# Patient Record
Sex: Female | Born: 2000 | Race: White | Hispanic: Yes | Marital: Single | Smoking: Never smoker
Health system: Southern US, Community
[De-identification: ages and names within clinical notes are randomized; demographics above are authoritative.]

## PROBLEM LIST (undated history)

## (undated) DIAGNOSIS — Z6221 Child in welfare custody: Secondary | ICD-10-CM

## (undated) DIAGNOSIS — IMO0002 Reserved for concepts with insufficient information to code with codable children: Secondary | ICD-10-CM

## (undated) HISTORY — DX: Reserved for concepts with insufficient information to code with codable children: IMO0002

## (undated) HISTORY — DX: Child in welfare custody: Z62.21

---
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
## 2019-04-07 IMAGING — MR MR KNEE*L* W/O CM
4 of 5 series · 20 of 40 positions shown · non-contrast
Comparison: None.

CLINICAL DATA: Left lateral knee pain for 7 years

EXAM:
MRI OF THE LEFT KNEE WITHOUT CONTRAST
TECHNIQUE: Multiplanar, multisequence MR imaging of the knee was performed. No
intravenous contrast was administered.

[Series 3: pd_tse_fs_tra · axial · 4.0mm · 0.42mm/px · z∈[-60,+10]mm · 3 of 24 slices shown]
[im 4/24]
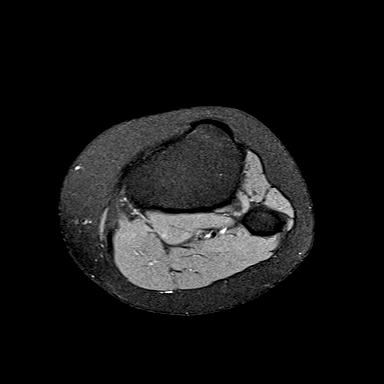
[im 14/24]
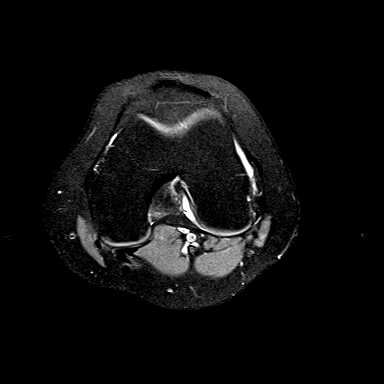
[im 20/24]
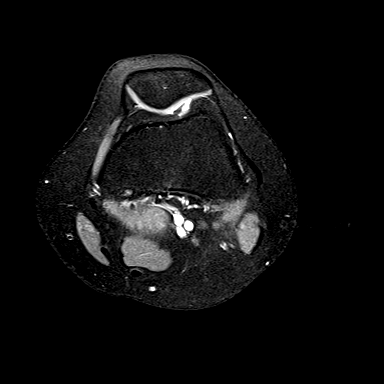

[Series 5: T2 fat-sat · coronal · 3.2mm · 0.62mm/px · 6 of 26 slices shown]
[im 1/26]
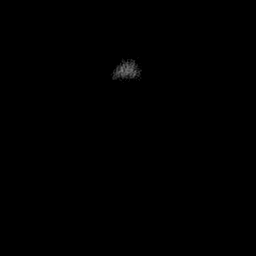
[im 4/26]
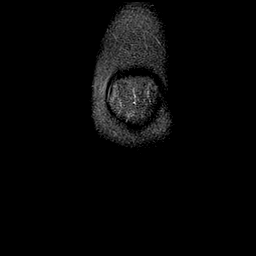
[im 8/26]
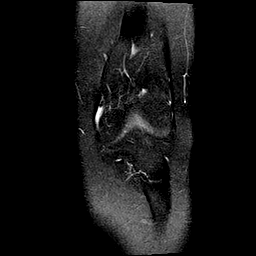
[im 11/26]
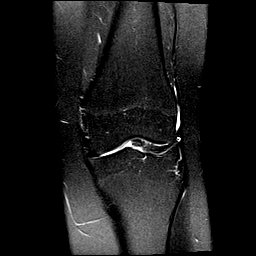
[im 15/26]
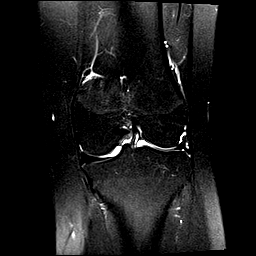
[im 22/26]
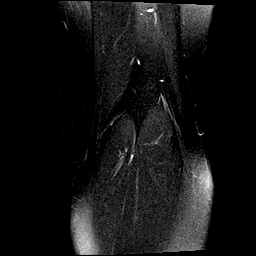

[Series 6: T1 · coronal · 3.2mm · 0.25mm/px · 3 of 26 slices shown]
[im 4/26]
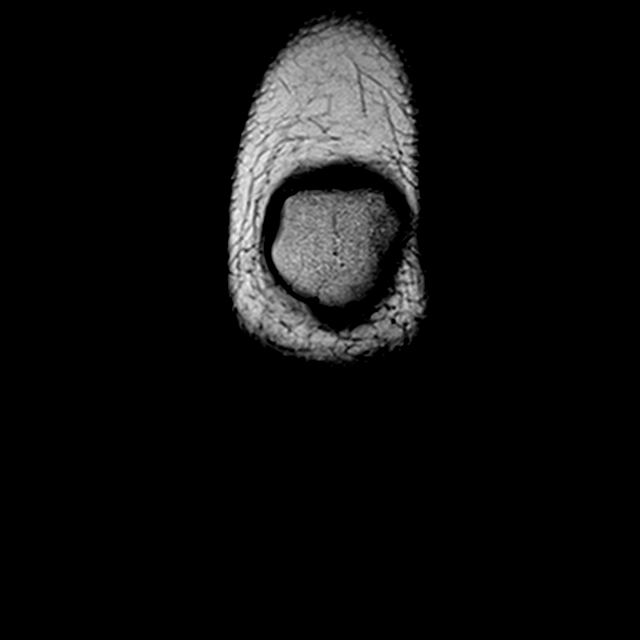
[im 15/26]
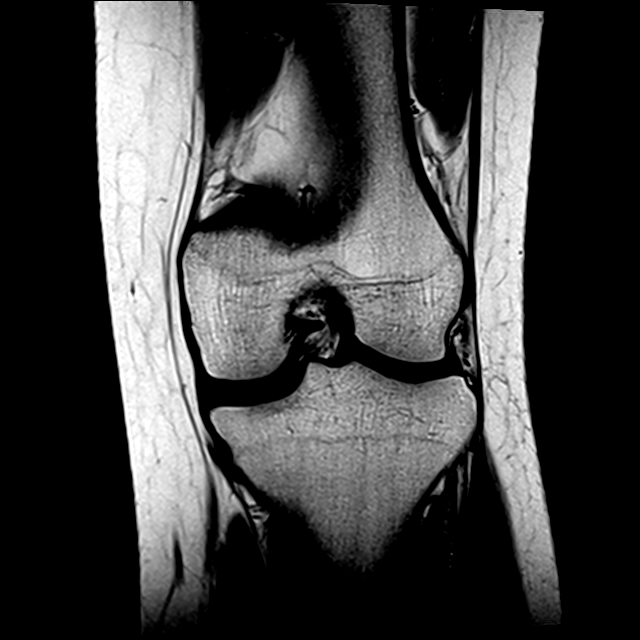
[im 22/26]
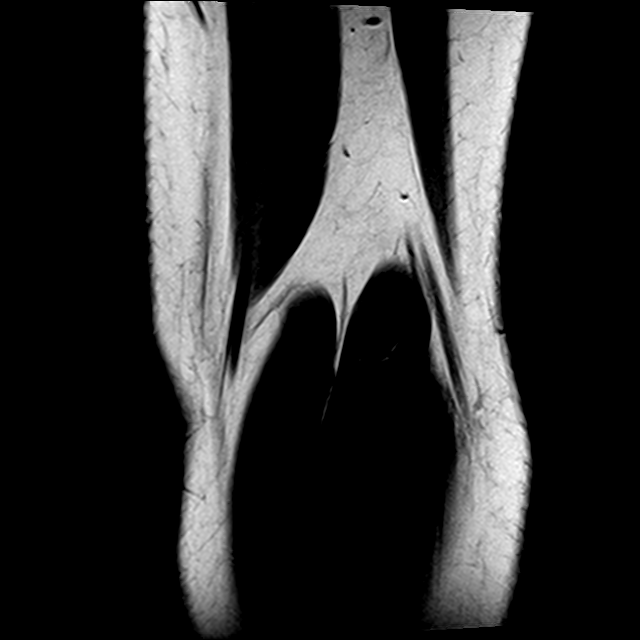

[Series 7: PD fat-sat · sagittal · 3.5mm · 0.25mm/px · 8 of 26 slices shown]
[im 1/26]
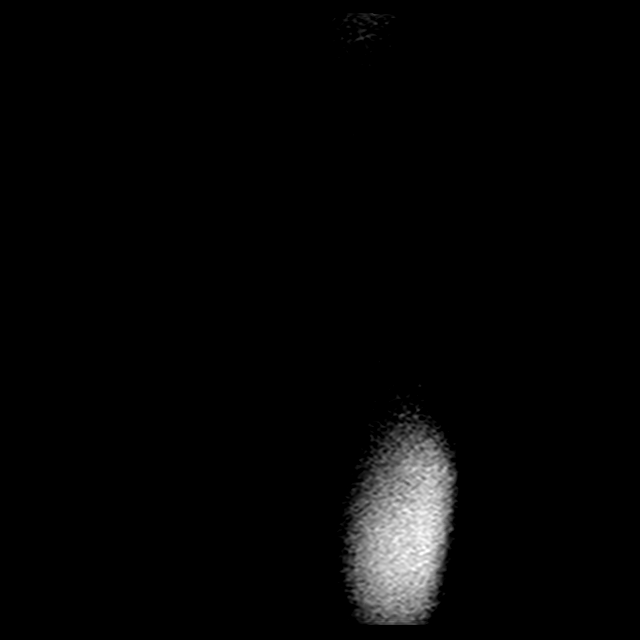
[im 4/26]
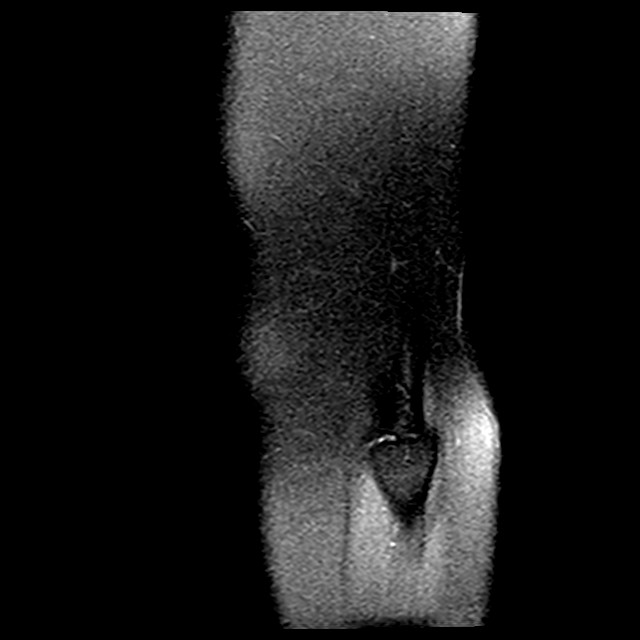
[im 8/26]
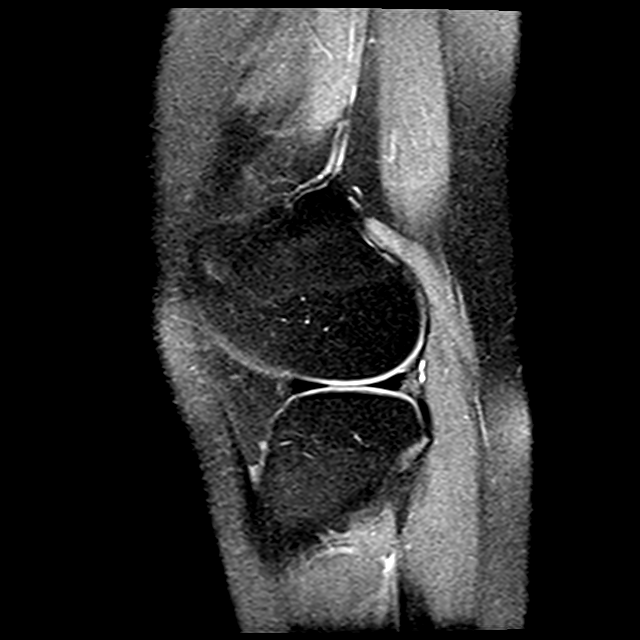
[im 11/26]
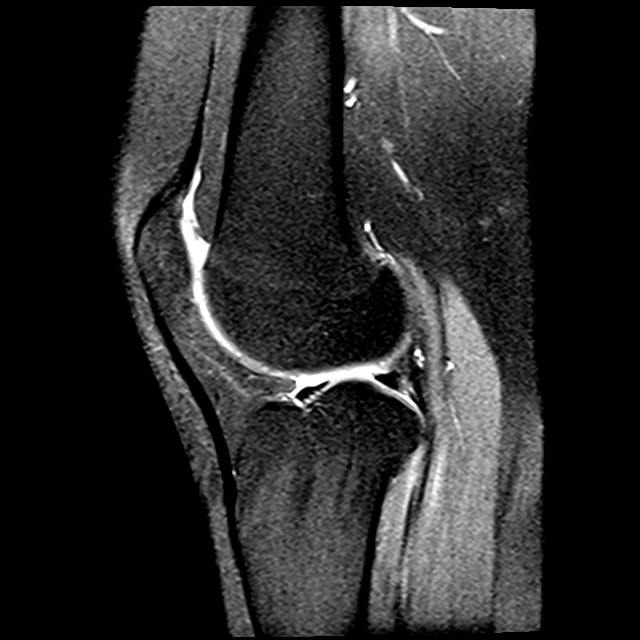
[im 15/26]
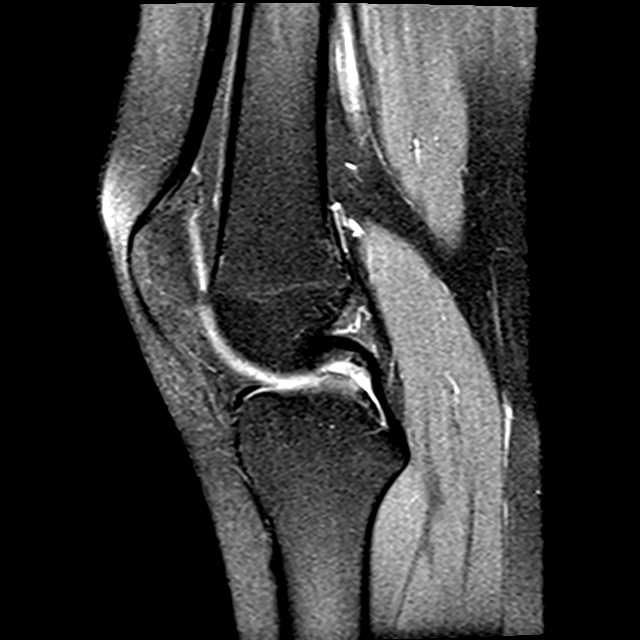
[im 18/26]
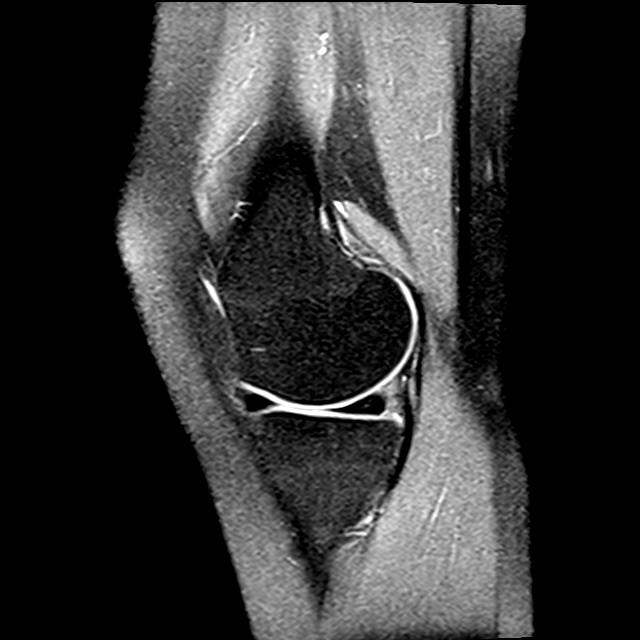
[im 22/26]
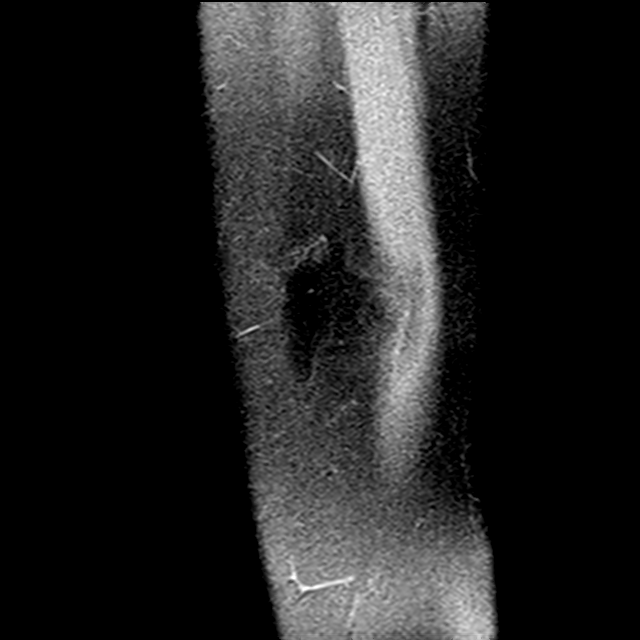
[im 26/26]
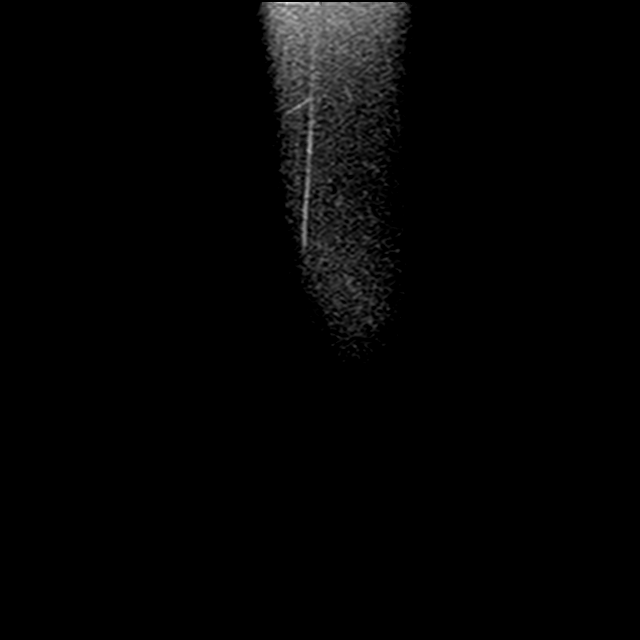

[20 of 40 positions shown; findings below may reference images not displayed]

FINDINGS: MENISCI

Medial meniscus:  Intact.

Lateral meniscus:  Intact.

LIGAMENTS

Cruciates:  Intact ACL and PCL.

Collaterals: Medial collateral ligament is intact. Lateral
collateral ligament complex is intact.

CARTILAGE

Patellofemoral:  No chondral defect.

Medial:  No chondral defect.

Lateral:  No chondral defect.

Joint:  No joint effusion. Normal Hoffa's fat. No plical thickening.

Popliteal Fossa: No Baker's cyst. Intact popliteus tendon. Normal
neurovascular bundles.

Extensor Mechanism: Intact quadriceps tendon and patellar tendon.
Intact medial and lateral patellar retinaculum. Intact MPFL.

Bones: No focal marrow signal abnormality. No fracture or
dislocation.

Soft tissue: Muscles are normal. No muscle edema. No muscle atrophy.
IMPRESSION: 1. No internal derangement of the left knee.

## 2020-06-16 ENCOUNTER — Other Ambulatory Visit: Payer: Self-pay

## 2020-06-16 ENCOUNTER — Ambulatory Visit
Admission: EM | Admit: 2020-06-16 | Discharge: 2020-06-16 | Disposition: A | Discharge status: Home / Self Care | Payer: Medicaid Other | Attending: Family Medicine | Admitting: Family Medicine

## 2020-06-16 ENCOUNTER — Telehealth: Payer: Self-pay | Admitting: Emergency Medicine

## 2020-06-16 DIAGNOSIS — R5383 Other fatigue: Secondary | ICD-10-CM | POA: Insufficient documentation

## 2020-06-16 DIAGNOSIS — R0981 Nasal congestion: Secondary | ICD-10-CM | POA: Diagnosis present

## 2020-06-16 DIAGNOSIS — Z1152 Encounter for screening for COVID-19: Secondary | ICD-10-CM | POA: Diagnosis present

## 2020-06-16 DIAGNOSIS — B349 Viral infection, unspecified: Secondary | ICD-10-CM | POA: Insufficient documentation

## 2020-06-16 DIAGNOSIS — R059 Cough, unspecified: Secondary | ICD-10-CM

## 2020-06-16 DIAGNOSIS — J029 Acute pharyngitis, unspecified: Secondary | ICD-10-CM | POA: Insufficient documentation

## 2020-06-16 LAB — POCT RAPID STREP A (OFFICE): Rapid Strep A Screen: NEGATIVE

## 2020-06-16 MED ORDER — PROMETHAZINE-DM 6.25-15 MG/5ML PO SYRP: 5.0000 mL | ORAL_SOLUTION | Freq: Four times a day (QID) | ORAL | 0 refills | Status: DC | PRN

## 2020-06-16 MED ORDER — PROMETHAZINE-DM 6.25-15 MG/5ML PO SYRP
5.0000 mL | ORAL_SOLUTION | Freq: Four times a day (QID) | ORAL | 0 refills | Status: AC | PRN
Start: 2020-06-16 — End: ?

## 2020-06-16 NOTE — ED Triage Notes (Signed)
Pt presents with c/o nasal congestion and sore throat that began 4 days ago
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
## 2020-06-16 NOTE — Discharge Instructions (Signed)
I have sent in cough syrup for you to take. This medication can make you sleepy. Do not drive while taking this medication.  Your COVID and Flu tests are pending.  You should self quarantine until the test results are back.    Take Tylenol or ibuprofen as needed for fever or discomfort.  Rest and keep yourself hydrated.    Follow-up with your primary care provider if your symptoms are not improving.     

## 2020-06-17 LAB — CULTURE, GROUP A STREP (THRC)

## 2020-06-19 LAB — CULTURE, GROUP A STREP (THRC)

## 2020-06-19 LAB — COVID-19, FLU A+B NAA
Influenza A, NAA: NOT DETECTED
Influenza B, NAA: NOT DETECTED
SARS-CoV-2, NAA: DETECTED — AB

## 2020-07-23 ENCOUNTER — Ambulatory Visit (INDEPENDENT_AMBULATORY_CARE_PROVIDER_SITE_OTHER): Payer: BC Managed Care – PPO | Admitting: Physician Assistant

## 2020-07-23 ENCOUNTER — Encounter: Payer: Self-pay | Admitting: Physician Assistant

## 2020-07-23 ENCOUNTER — Ambulatory Visit (INDEPENDENT_AMBULATORY_CARE_PROVIDER_SITE_OTHER): Payer: BC Managed Care – PPO

## 2020-07-23 DIAGNOSIS — S93402A Sprain of unspecified ligament of left ankle, initial encounter: Secondary | ICD-10-CM

## 2020-07-23 DIAGNOSIS — M25572 Pain in left ankle and joints of left foot: Secondary | ICD-10-CM
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
## 2020-07-23 NOTE — Addendum Note (Signed)
Addended by: Barbette Or on: 07/23/2020 01:03 PM   Modules accepted: Orders

## 2020-08-14 ENCOUNTER — Ambulatory Visit: Payer: Medicaid Other | Attending: Physician Assistant | Admitting: Physical Therapy

## 2020-09-25 ENCOUNTER — Ambulatory Visit
Admission: EM | Admit: 2020-09-25 | Discharge: 2020-09-25 | Disposition: A | Discharge status: Home / Self Care | Payer: BC Managed Care – PPO | Attending: Family Medicine | Admitting: Family Medicine

## 2020-09-25 ENCOUNTER — Encounter: Payer: Self-pay | Admitting: Emergency Medicine

## 2020-09-25 ENCOUNTER — Other Ambulatory Visit: Payer: Self-pay

## 2020-09-25 DIAGNOSIS — R509 Fever, unspecified: Secondary | ICD-10-CM

## 2020-09-25 DIAGNOSIS — J069 Acute upper respiratory infection, unspecified: Secondary | ICD-10-CM | POA: Diagnosis not present

## 2020-09-25 MED ORDER — IBUPROFEN 800 MG PO TABS
800.0000 mg | ORAL_TABLET | Freq: Once | ORAL | Status: AC
  Administered 2020-09-25: 800 mg via ORAL
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
## 2020-09-25 NOTE — ED Triage Notes (Signed)
Body aches, cough, sore throat that started last night.

## 2020-09-25 NOTE — Discharge Instructions (Signed)
Your COVID and Influenza tests are pending.  You should self quarantine until the test results are back.    Take Tylenol or ibuprofen as needed for fever or discomfort.  Rest and keep yourself hydrated.    Follow-up with your primary care provider if your symptoms are not improving.     

## 2020-09-26 ENCOUNTER — Telehealth: Payer: Self-pay | Admitting: Family Medicine

## 2020-09-26 DIAGNOSIS — J101 Influenza due to other identified influenza virus with other respiratory manifestations: Secondary | ICD-10-CM

## 2020-09-26 LAB — COVID-19, FLU A+B NAA
Influenza A, NAA: DETECTED — AB
Influenza B, NAA: NOT DETECTED
SARS-CoV-2, NAA: NOT DETECTED

## 2020-09-26 MED ORDER — OSELTAMIVIR PHOSPHATE 75 MG PO CAPS: 75.0000 mg | ORAL_CAPSULE | Freq: Two times a day (BID) | ORAL | 0 refills | Status: AC

## 2020-09-26 NOTE — Telephone Encounter (Signed)
Spoke with patient and let her know that her swab was positive for Flu A. Prescribed tamiflu BID x 5 days. Verbalized understanding. Follow up as needed

## 2020-09-27 ENCOUNTER — Telehealth (HOSPITAL_COMMUNITY): Payer: Self-pay | Admitting: Emergency Medicine

## 2020-09-27 MED ORDER — ONDANSETRON 4 MG PO TBDP: 4.0000 mg | ORAL_TABLET | Freq: Four times a day (QID) | ORAL | 0 refills | Status: AC | PRN

## 2020-09-27 NOTE — Telephone Encounter (Signed)
Spoke to patient, who states she is having severe nausea this morning and is uncomfortable.  PEr Kari Murillo, okay to send Zofran.  Reviewed with patient, verified pharmacy, noq uestions at this time

## 2021-05-15 ENCOUNTER — Ambulatory Visit
Admission: EM | Admit: 2021-05-15 | Discharge: 2021-05-15 | Disposition: A | Discharge status: Home / Self Care | Payer: BC Managed Care – PPO | Attending: Family Medicine | Admitting: Family Medicine

## 2021-05-15 ENCOUNTER — Encounter: Payer: Self-pay | Admitting: Emergency Medicine

## 2021-05-15 DIAGNOSIS — H60393 Other infective otitis externa, bilateral: Secondary | ICD-10-CM | POA: Diagnosis not present

## 2021-05-15 DIAGNOSIS — H9193 Unspecified hearing loss, bilateral: Secondary | ICD-10-CM | POA: Diagnosis not present

## 2021-05-15 MED ORDER — CIPROFLOXACIN-DEXAMETHASONE 0.3-0.1 % OT SUSP: 4.0000 [drp] | Freq: Two times a day (BID) | OTIC | 0 refills | Status: AC

## 2021-05-15 MED ORDER — FLUTICASONE PROPIONATE 50 MCG/ACT NA SUSP: 1.0000 | Freq: Two times a day (BID) | NASAL | 0 refills | Status: AC

## 2021-05-15 NOTE — ED Triage Notes (Signed)
Bilateral Ear pain x 2 days.  Can't hear well out of right ear.
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
## 2022-02-05 ENCOUNTER — Ambulatory Visit
Admission: EM | Admit: 2022-02-05 | Discharge: 2022-02-05 | Disposition: A | Discharge status: Home / Self Care | Payer: Medicaid Other | Attending: Family Medicine | Admitting: Family Medicine

## 2022-02-05 ENCOUNTER — Encounter: Payer: Self-pay | Admitting: Emergency Medicine

## 2022-02-05 DIAGNOSIS — R42 Dizziness and giddiness: Secondary | ICD-10-CM

## 2022-02-05 DIAGNOSIS — R112 Nausea with vomiting, unspecified: Secondary | ICD-10-CM | POA: Diagnosis not present

## 2022-02-05 LAB — POCT URINALYSIS DIP (MANUAL ENTRY)
Bilirubin, UA: NEGATIVE
Blood, UA: NEGATIVE
Glucose, UA: NEGATIVE mg/dL
Ketones, POC UA: NEGATIVE mg/dL
Nitrite, UA: NEGATIVE
Protein Ur, POC: NEGATIVE mg/dL
Spec Grav, UA: >=1.03 — AB (ref 1.010–1.025)
Urobilinogen, UA: 0.2 E.U./dL
pH, UA: 6 (ref 5.0–8.0)

## 2022-02-05 LAB — POCT URINE PREGNANCY: Preg Test, Ur: NEGATIVE

## 2022-02-05 LAB — POCT FASTING CBG KUC MANUAL ENTRY: POCT Glucose (KUC): 96 mg/dL (ref 70–99)

## 2022-02-05 MED ORDER — ONDANSETRON 4 MG PO TBDP: 4.0000 mg | ORAL_TABLET | Freq: Three times a day (TID) | ORAL | 0 refills | Status: AC | PRN
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
## 2022-02-06 LAB — CBC WITH DIFFERENTIAL/PLATELET
Basophils Absolute: 0.1 10*3/uL (ref 0.0–0.2)
Basos: 1 %
EOS (ABSOLUTE): 0.1 10*3/uL (ref 0.0–0.4)
Eos: 2 %
Hematocrit: 41.4 % (ref 34.0–46.6)
Hemoglobin: 13.9 g/dL (ref 11.1–15.9)
Immature Grans (Abs): 0 10*3/uL (ref 0.0–0.1)
Immature Granulocytes: 0 %
Lymphocytes Absolute: 3.6 10*3/uL — ABNORMAL HIGH (ref 0.7–3.1)
Lymphs: 38 %
MCH: 30.5 pg (ref 26.6–33.0)
MCHC: 33.6 g/dL (ref 31.5–35.7)
MCV: 91 fL (ref 79–97)
Monocytes Absolute: 0.5 10*3/uL (ref 0.1–0.9)
Monocytes: 6 %
Neutrophils Absolute: 5 10*3/uL (ref 1.4–7.0)
Neutrophils: 53 %
Platelets: 408 10*3/uL (ref 150–450)
RBC: 4.56 x10E6/uL (ref 3.77–5.28)
RDW: 12.1 % (ref 11.7–15.4)
WBC: 9.3 10*3/uL (ref 3.4–10.8)

## 2022-02-06 LAB — COMPREHENSIVE METABOLIC PANEL
ALT: 182 IU/L — ABNORMAL HIGH (ref 0–32)
AST: 72 IU/L — ABNORMAL HIGH (ref 0–40)
Albumin/Globulin Ratio: 1.7 (ref 1.2–2.2)
Albumin: 4.7 g/dL (ref 4.0–5.0)
Alkaline Phosphatase: 138 IU/L — ABNORMAL HIGH (ref 44–121)
BUN/Creatinine Ratio: 16 (ref 9–23)
BUN: 9 mg/dL (ref 6–20)
Bilirubin Total: 0.4 mg/dL (ref 0.0–1.2)
CO2: 16 mmol/L — ABNORMAL LOW (ref 20–29)
Calcium: 9.2 mg/dL (ref 8.7–10.2)
Chloride: 105 mmol/L (ref 96–106)
Creatinine, Ser: 0.58 mg/dL (ref 0.57–1.00)
Globulin, Total: 2.7 g/dL (ref 1.5–4.5)
Glucose: 89 mg/dL (ref 70–99)
Potassium: 3.8 mmol/L (ref 3.5–5.2)
Sodium: 139 mmol/L (ref 134–144)
Total Protein: 7.4 g/dL (ref 6.0–8.5)
eGFR: 131 mL/min/{1.73_m2} (ref >59)

## 2022-02-06 LAB — LIPASE: Lipase: 44 U/L (ref 14–72)

## 2022-09-15 ENCOUNTER — Other Ambulatory Visit: Payer: Self-pay | Admitting: Family Medicine

## 2022-09-16 NOTE — Telephone Encounter (Signed)
Urgent Care patient Requested Prescriptions  Pending Prescriptions Disp Refills  . fluticasone (FLONASE) 50 MCG/ACT nasal spray [Pharmacy Med Name: FLUTICASONE PROP 50 MCG SPRAY] 16 mL     Sig: PLACE 1 SPRAY INTO BOTH NOSTRILS 2 (TWO) TIMES DAILY     There is no refill protocol information for this order     

## 2022-11-27 ENCOUNTER — Encounter: Payer: Medicaid Other | Admitting: Adult Health

## 2023-04-16 ENCOUNTER — Ambulatory Visit: Payer: Medicaid Other | Admitting: Advanced Practice Midwife

## 2023-09-17 ENCOUNTER — Emergency Department
Admission: EM | Admit: 2023-09-17 | Discharge: 2023-09-17 | Discharge status: Against Medical Advice | Attending: Emergency Medicine | Admitting: Emergency Medicine

## 2023-09-17 ENCOUNTER — Other Ambulatory Visit: Payer: Self-pay

## 2023-09-17 DIAGNOSIS — K0889 Other specified disorders of teeth and supporting structures: Secondary | ICD-10-CM | POA: Diagnosis present

## 2023-09-17 DIAGNOSIS — Z5321 Procedure and treatment not carried out due to patient leaving prior to being seen by health care provider: Secondary | ICD-10-CM | POA: Diagnosis not present

## 2023-09-17 MED ORDER — OXYCODONE-ACETAMINOPHEN 5-325 MG PO TABS
1.0000 | ORAL_TABLET | Freq: Once | ORAL | Status: AC
  Administered 2023-09-17: 1 via ORAL
  Filled 2023-09-17: qty 1

## 2023-09-17 NOTE — ED Triage Notes (Signed)
 Pt reports left side dental pain that began an hour ago.
